# Patient Record
Sex: Male | Born: 2005 | Race: White | Hispanic: No | Marital: Single | Smoking: Never smoker
Health system: Southern US, Community
[De-identification: ages and names within clinical notes are randomized; demographics above are authoritative.]

## PROBLEM LIST (undated history)

## (undated) DIAGNOSIS — E785 Hyperlipidemia, unspecified: Secondary | ICD-10-CM

## (undated) DIAGNOSIS — E669 Obesity, unspecified: Secondary | ICD-10-CM

## (undated) HISTORY — PX: CIRCUMCISION: SUR203

## (undated) HISTORY — DX: Obesity, unspecified: E66.9

## (undated) HISTORY — DX: Hyperlipidemia, unspecified: E78.5

## (undated) HISTORY — PX: ABCESS DRAINAGE: SHX399

---
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
## 2024-02-23 ENCOUNTER — Other Ambulatory Visit (HOSPITAL_COMMUNITY): Payer: Self-pay | Admitting: Urology

## 2024-02-23 DIAGNOSIS — Q6239 Other obstructive defects of renal pelvis and ureter: Secondary | ICD-10-CM
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
## 2024-07-01 ENCOUNTER — Ambulatory Visit: Payer: Self-pay | Admitting: Neurology

## 2024-07-01 ENCOUNTER — Encounter: Payer: Self-pay | Admitting: Neurology
